# Patient Record
Sex: Female | Born: 1960 | Hispanic: No | Marital: Married | Smoking: Never smoker
Health system: Southern US, Community
[De-identification: ages and names within clinical notes are randomized; demographics above are authoritative.]

## PROBLEM LIST (undated history)

## (undated) DIAGNOSIS — E785 Hyperlipidemia, unspecified: Secondary | ICD-10-CM

## (undated) DIAGNOSIS — E039 Hypothyroidism, unspecified: Secondary | ICD-10-CM

## (undated) DIAGNOSIS — D509 Iron deficiency anemia, unspecified: Secondary | ICD-10-CM

## (undated) DIAGNOSIS — N183 Chronic kidney disease, stage 3 unspecified: Secondary | ICD-10-CM

## (undated) DIAGNOSIS — I10 Essential (primary) hypertension: Secondary | ICD-10-CM

## (undated) DIAGNOSIS — E669 Obesity, unspecified: Secondary | ICD-10-CM

## (undated) DIAGNOSIS — E079 Disorder of thyroid, unspecified: Secondary | ICD-10-CM

## (undated) DIAGNOSIS — R739 Hyperglycemia, unspecified: Secondary | ICD-10-CM

## (undated) HISTORY — PX: TUBAL LIGATION: SHX77

## (undated) HISTORY — DX: Chronic kidney disease, stage 3 (moderate): N18.3

## (undated) HISTORY — DX: Hyperlipidemia, unspecified: E78.5

## (undated) HISTORY — DX: Obesity, unspecified: E66.9

## (undated) HISTORY — DX: Hyperglycemia, unspecified: R73.9

## (undated) HISTORY — DX: Chronic kidney disease, stage 3 unspecified: N18.30

## (undated) HISTORY — DX: Iron deficiency anemia, unspecified: D50.9

## (undated) HISTORY — DX: Hypothyroidism, unspecified: E03.9

---

## 2005-07-08 ENCOUNTER — Other Ambulatory Visit: Admission: RE | Admit: 2005-07-08 | Discharge: 2005-07-08 | Payer: Self-pay | Admitting: Family Medicine

## 2007-01-20 ENCOUNTER — Other Ambulatory Visit: Admission: RE | Admit: 2007-01-20 | Discharge: 2007-01-20 | Payer: Self-pay | Admitting: Family Medicine

## 2008-07-17 ENCOUNTER — Other Ambulatory Visit: Admission: RE | Admit: 2008-07-17 | Discharge: 2008-07-17 | Payer: Self-pay | Admitting: Family Medicine

## 2010-07-22 ENCOUNTER — Other Ambulatory Visit (HOSPITAL_COMMUNITY)
Admission: RE | Admit: 2010-07-22 | Discharge: 2010-07-22 | Disposition: A | Discharge status: Home / Self Care | Payer: Self-pay | Source: Ambulatory Visit | Attending: Family Medicine | Admitting: Family Medicine

## 2010-07-22 DIAGNOSIS — Z124 Encounter for screening for malignant neoplasm of cervix: Secondary | ICD-10-CM | POA: Insufficient documentation

## 2010-07-23 ENCOUNTER — Other Ambulatory Visit: Payer: Self-pay | Admitting: Family Medicine

## 2013-02-21 ENCOUNTER — Other Ambulatory Visit: Payer: Self-pay | Admitting: Family Medicine

## 2013-02-21 ENCOUNTER — Ambulatory Visit
Admission: RE | Admit: 2013-02-21 | Discharge: 2013-02-21 | Disposition: A | Discharge status: Home / Self Care | Payer: BC Managed Care – PPO | Source: Ambulatory Visit | Attending: Family Medicine | Admitting: Family Medicine

## 2013-02-21 DIAGNOSIS — I10 Essential (primary) hypertension: Secondary | ICD-10-CM

## 2013-02-21 DIAGNOSIS — M25579 Pain in unspecified ankle and joints of unspecified foot: Secondary | ICD-10-CM

## 2013-05-04 ENCOUNTER — Emergency Department (HOSPITAL_COMMUNITY)
Admission: EM | Admit: 2013-05-04 | Discharge: 2013-05-04 | Disposition: A | Discharge status: Home / Self Care | Payer: 59 | Source: Home / Self Care

## 2013-05-04 ENCOUNTER — Encounter (HOSPITAL_COMMUNITY): Payer: Self-pay | Admitting: Emergency Medicine

## 2013-05-04 DIAGNOSIS — L218 Other seborrheic dermatitis: Secondary | ICD-10-CM

## 2013-05-04 DIAGNOSIS — L219 Seborrheic dermatitis, unspecified: Secondary | ICD-10-CM

## 2013-05-04 HISTORY — DX: Essential (primary) hypertension: I10

## 2013-05-04 HISTORY — DX: Disorder of thyroid, unspecified: E07.9

## 2013-05-04 MED ORDER — CLOTRIMAZOLE-BETAMETHASONE 1-0.05 % EX CREA: TOPICAL_CREAM | CUTANEOUS | Status: AC

## 2013-05-04 NOTE — ED Provider Notes (Signed)
Medical screening examination/treatment/procedure(s) were performed by non-physician practitioner and as supervising physician I was immediately available for consultation/collaboration.  Jakevion Arney, M.D.  Tita Terhaar C Antania Hoefling, MD 05/04/13 2303 

## 2013-05-04 NOTE — ED Notes (Signed)
C/o 3 month duration of blistering and itching in scalp not relieved w selsun shampoo. C/o she cannot get an appointment w dermatologist for 3 months, "and I'll be bald by then"

## 2013-05-04 NOTE — ED Provider Notes (Signed)
CSN: [id number]     Arrival date & time 05/04/13  1653 History   First MD Initiated Contact with Patient 05/04/13 1931     Chief Complaint  Patient presents with  . Hair/Scalp Problem   (Consider location/radiation/quality/duration/timing/severity/associated sxs/prior Treatment) HPI Comments: 53 year old female complaining of a ring of pruritic plaques with silver scales and flaking to the hairline at the right frontoparietal scalp. This started several weeks ago. [facility] which is helped with some of the redness and inflammation but continues to have the hair loss and plaques as above.   Past Medical History  Diagnosis Date  . Thyroid disease   . Hypertension    History reviewed. No pertinent past surgical history. History reviewed. No pertinent family history. History  Substance Use Topics  . Smoking status: Never Smoker   . Smokeless tobacco: Not on file  . Alcohol Use: No   OB History   Grav Para Term Preterm Abortions TAB SAB Ect Mult Living                 Review of Systems  Constitutional: Negative.   Skin:       As per history of present illness  All other systems reviewed and are negative.    Allergies  Motrin  Home Medications   Current Outpatient Rx  Name  Route  Sig  Dispense  Refill  . levothyroxine (SYNTHROID, LEVOTHROID) 100 MCG tablet   Oral   Take 100 mcg by mouth daily before breakfast.         . triamterene-hydrochlorothiazide (MAXZIDE) 75-50 MG per tablet   Oral   Take 1 tablet by mouth daily.         . clotrimazole-betamethasone (LOTRISONE) cream      Apply to affected area 2 times daily prn   45 g   0    BP 145/95  Pulse 98  Temp(Src) 98.1 F (36.7 C) (Oral)  Resp 16  SpO2 100% Physical Exam  Nursing note and vitals reviewed. Constitutional: She is oriented to person, place, and time. She appears well-developed and well-nourished. No distress.  Neck: Normal range of motion. Neck supple.   Pulmonary/Chest: Effort normal. No respiratory distress.  Neurological: She is alert and oriented to person, place, and time.  Skin: Skin is warm and dry.  See history of present illness. A ring of plaques covered with silvery and darkening scales associated with much flaking and desquamation. No current erythema, drainage or signs of bacterial infection. This is limited to an area approximately 5 x 4 cm.  Psychiatric: She has a normal mood and affect.    ED Course  Procedures (including critical care time) Labs Review Labs Reviewed - No data to display Imaging Review No results found.   MDM   1. Seborrheic dermatitis of scalp      Selsun Blue twice a week Lotrisone twice a day Followup your PCP or keep dermatology appointment in 3 months  Hayden Rasmussenavid Tira Lafferty, NP 05/04/13 2005

## 2013-05-04 NOTE — Discharge Instructions (Signed)
Seborrheic Dermatitis Continue the [employer] twice a week. Seborrheic dermatitis involves pink or red skin with greasy, flaky scales. This is often found on the scalp, eyebrows, nose, bearded area, and on or behind the ears. It can also occur on the central chest. It often occurs where there are more oil (sebaceous) glands. This condition is also known as dandruff. When this condition affects a baby's scalp, it is called cradle cap. It may come and go for no known reason. It can occur at any time of life from infancy to old age. CAUSES  The cause is unknown. It is not the result of too little moisture or too much oil. In some people, seborrheic dermatitis flare-ups seem to be triggered by stress. It also commonly occurs in people with certain diseases such as Parkinson's disease or HIV/AIDS. SYMPTOMS   Thick scales on the scalp.  Redness on the face or in the armpits.  The skin may seem oily or dry, but moisturizers do not help.  In infants, seborrheic dermatitis appears as scaly redness that does not seem to bother the baby. In some babies, it affects only the scalp. In others, it also affects the neck creases, armpits, groin, or behind the ears.  In adults and adolescents, seborrheic dermatitis may affect only the scalp. It may look patchy or spread out, with areas of redness and flaking. Other areas commonly affected include:  Eyebrows.  Eyelids.  Forehead.  Skin behind the ears.  Outer ears.  Chest.  Armpits.  Nose creases.  Skin creases under the breasts.  Skin between the buttocks.  Groin.  Some adults and adolescents feel itching or burning in the affected areas. DIAGNOSIS  Your caregiver can usually tell what the problem is by doing a physical exam. TREATMENT   Cortisone (steroid) ointments, creams, and lotions can help decrease inflammation.  Babies can be treated with baby oil to soften the scales, then they may be washed with baby shampoo. If this does not  help, a prescription topical steroid medicine may work.  Adults can use medicated shampoos.  Your caregiver may prescribe corticosteroid cream and shampoo containing an antifungal or yeast medicine (ketoconazole). Hydrocortisone or anti-yeast cream can be rubbed directly onto seborrheic dermatitis patches. Yeast does not cause seborrheic dermatitis, but it seems to add to the problem. In infants, seborrheic dermatitis is often worst during the first year of life. It tends to disappear on its own as the child grows. However, it may return during the teenage years. In adults and adolescents, seborrheic dermatitis tends to be a long-lasting condition that comes and goes over many years. HOME CARE INSTRUCTIONS   Use prescribed medicines as directed.  In infants, do not aggressively remove the scales or flakes on the scalp with a comb or by other means. This may lead to hair loss. SEEK MEDICAL CARE IF:   The problem does not improve from the medicated shampoos, lotions, or other medicines given by your caregiver.  You have any other questions or concerns. Document Released: 02/22/2005 Document Revised: 08/24/2011 Document Reviewed: 07/14/2009 [facility] Patient Information 2014 [employer], [place].

## 2013-09-06 ENCOUNTER — Other Ambulatory Visit: Payer: Self-pay | Admitting: Family Medicine

## 2013-09-06 ENCOUNTER — Other Ambulatory Visit (HOSPITAL_COMMUNITY)
Admission: RE | Admit: 2013-09-06 | Discharge: 2013-09-06 | Disposition: A | Discharge status: Home / Self Care | Payer: 59 | Source: Ambulatory Visit | Attending: Family Medicine | Admitting: Family Medicine

## 2013-09-06 DIAGNOSIS — Z1151 Encounter for screening for human papillomavirus (HPV): Secondary | ICD-10-CM | POA: Insufficient documentation

## 2013-09-06 DIAGNOSIS — Z124 Encounter for screening for malignant neoplasm of cervix: Secondary | ICD-10-CM | POA: Insufficient documentation

## 2013-09-11 LAB — CYTOLOGY - PAP

## 2014-12-23 IMAGING — CR DG ANKLE COMPLETE 3+V*R*
3 series · 3 of 3 positions shown · non-contrast
Comparison: Left ankle examination.

CLINICAL DATA: History of soft tissue swelling and pain. No history
of trauma.

EXAM:
RIGHT ANKLE - COMPLETE 3+ VIEW

[view not recorded (1 of 3)]
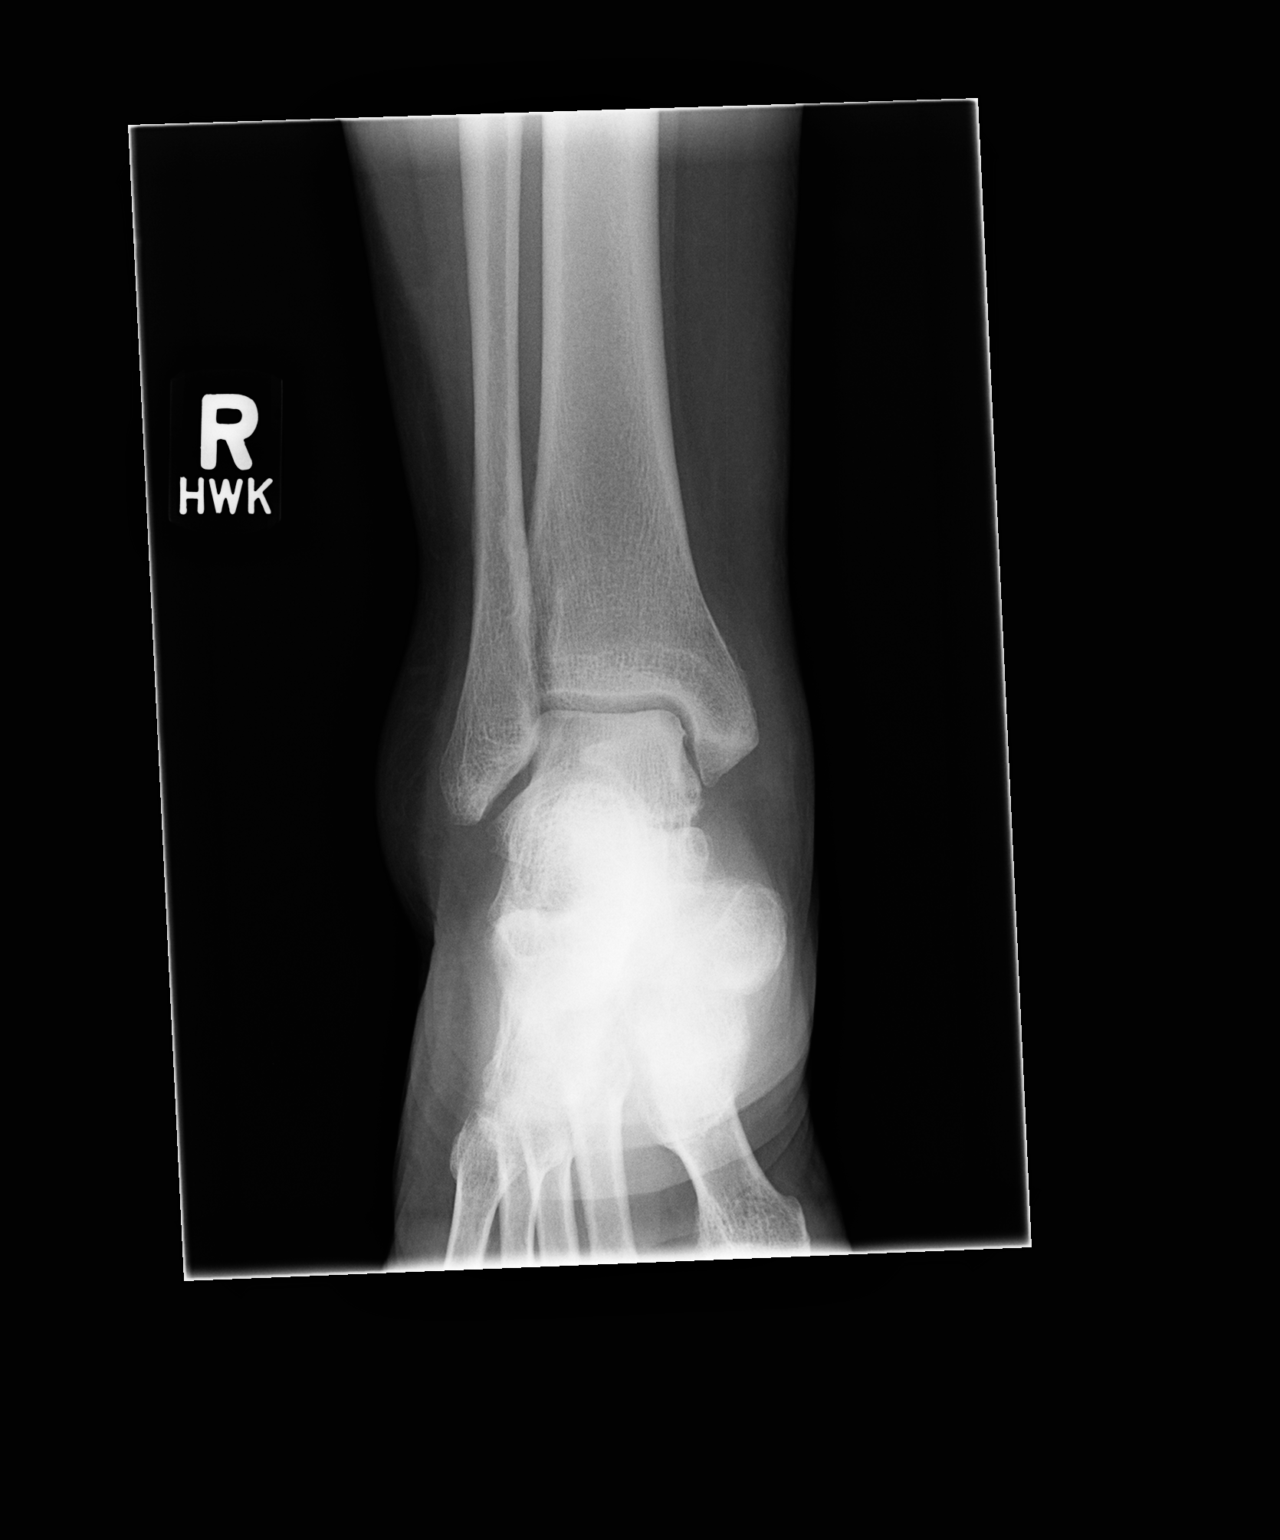

[view not recorded (2 of 3)]
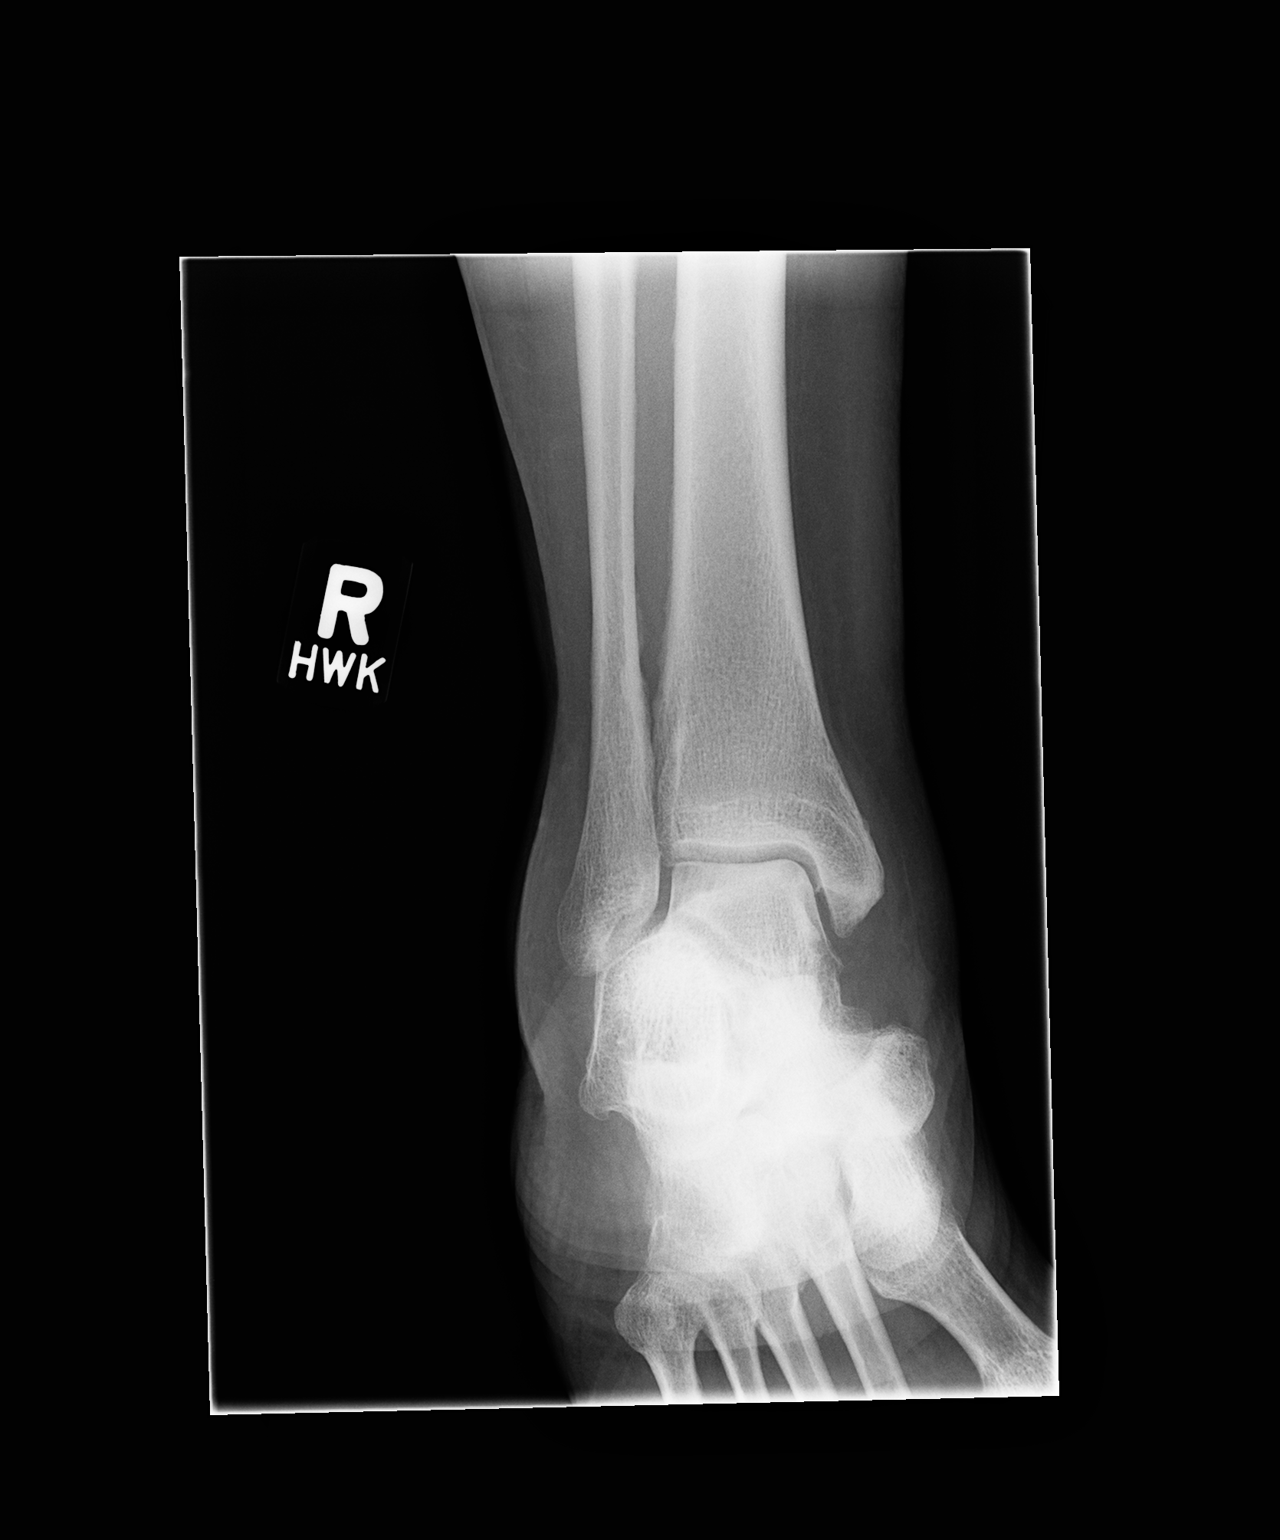

[view not recorded (3 of 3)]
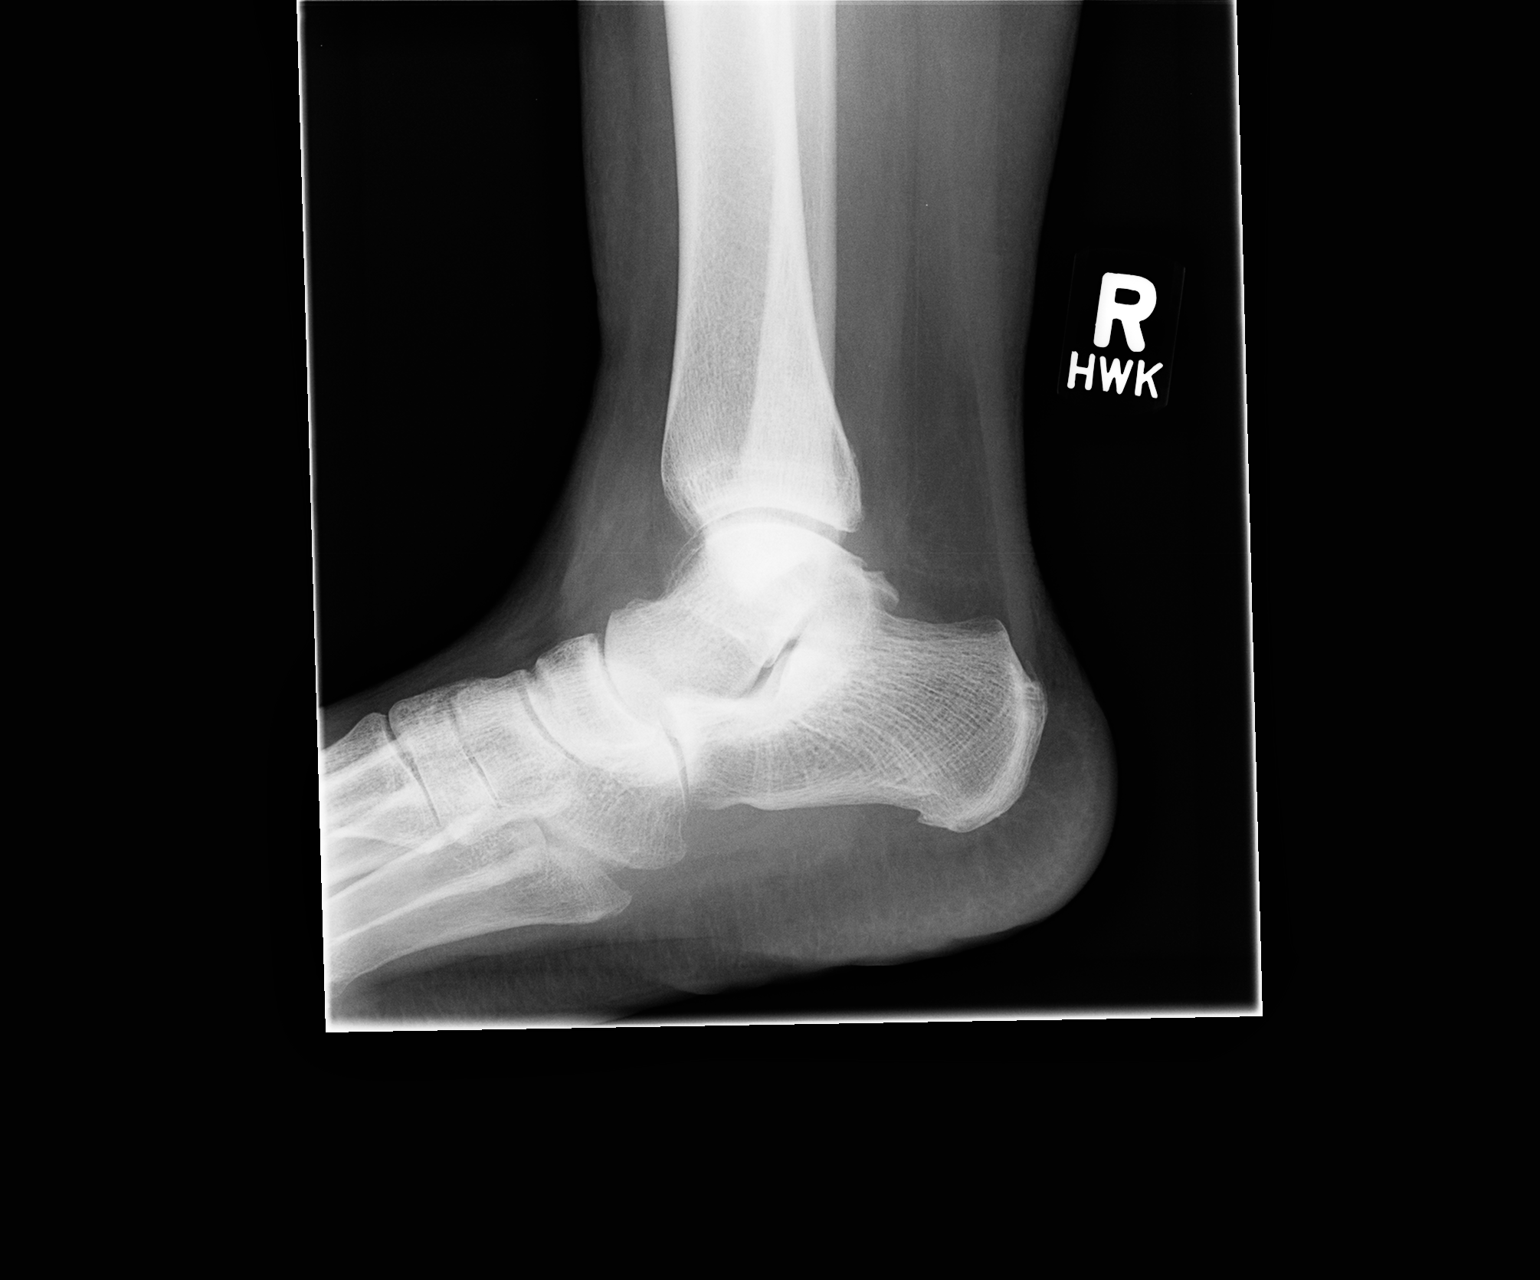

[3 of 3 positions shown; findings below may reference images not displayed]

FINDINGS: There is a mild soft tissue thickening or swelling. Alignment is
normal. No fracture, dislocation, or bony destruction is seen.
Minimal posterior and plantar calcaneal spurring is present.
IMPRESSION: Mild soft tissue thickening or swelling. Minimal posterior and
plantar calcaneal spurring.

## 2015-09-29 ENCOUNTER — Encounter: Payer: Self-pay | Admitting: Cardiology

## 2015-10-17 ENCOUNTER — Encounter: Payer: Self-pay | Admitting: Cardiology

## 2015-10-17 ENCOUNTER — Encounter (INDEPENDENT_AMBULATORY_CARE_PROVIDER_SITE_OTHER): Payer: Self-pay

## 2015-10-17 ENCOUNTER — Ambulatory Visit (INDEPENDENT_AMBULATORY_CARE_PROVIDER_SITE_OTHER): Payer: 59 | Admitting: Cardiology

## 2015-10-17 VITALS — BP 138/100 | HR 76 | Ht 64.5 in | Wt 222.6 lb

## 2015-10-17 DIAGNOSIS — R0789 Other chest pain: Secondary | ICD-10-CM

## 2015-10-17 DIAGNOSIS — I10 Essential (primary) hypertension: Secondary | ICD-10-CM | POA: Diagnosis not present

## 2015-10-17 DIAGNOSIS — Z8249 Family history of ischemic heart disease and other diseases of the circulatory system: Secondary | ICD-10-CM | POA: Diagnosis not present

## 2015-10-17 DIAGNOSIS — I739 Peripheral vascular disease, unspecified: Secondary | ICD-10-CM | POA: Diagnosis not present

## 2015-10-17 NOTE — Progress Notes (Signed)
Cardiology Office Note    Date:  10/17/2015   ID:  Candice MaywoodMary M Mcdonnell, DOB 04/15/1960, MRN [id number]  PCP:  Frederich ChickWEBB, CAROL D, MD  Cardiologist:   Donato SchultzMark Skains, MD   No chief complaint on file.   History of Present Illness:  Candice Preston is a 55 y.o. female here for evaluation of atypical chest pain at the request of Dr. Hyman HopesWebb. Her mother died at age 53 from myocardial infarction.  In review of office note from 09/11/15 from Dr. Hyman HopesWebb, she stated that she was having occasional chest discomfort usually when sitting down and felt better when she pressed on her chest wall. Feels it when she is stressed and moving. Once while walking at work noted it after eating. She felt as though it was like trapped gas that sometimes is worse with movements or different positions. She would feel it every other day. She is concerned however because of her mother's heart attack at age 53.  She is a nonsmoker, no prior cardiac evaluation.  Leg pain with walking at times and burning at rest. She has also been diagnosed in the past with plantar fasciitis and occasionally will have shooting neuropathic pain from her feet/heels.  Hemoglobin A1c 5.5, creatinine 1.1, ALT 18, LDL 143, HDL 47.    Past Medical History:  Diagnosis Date  . CKD (chronic kidney disease), stage III   . Hyperglycemia   . Hyperlipidemia   . Hypertension   . Hypothyroidism   . Iron deficiency anemia   . Obesity   . Thyroid disease     Past Surgical History:  Procedure Laterality Date  . CESAREAN SECTION  1987, 1988   x2  . TUBAL LIGATION Bilateral     Current Medications: Outpatient Medications Prior to Visit  Medication Sig Dispense Refill  . clotrimazole-betamethasone (LOTRISONE) cream Apply to affected area 2 times daily prn 45 g 0  . Cyanocobalamin (VITAMIN B-12) 5000 MCG TBDP Take 1 tablet by mouth every other day.    . levothyroxine (SYNTHROID, LEVOTHROID) 100 MCG tablet Take 100 mcg by mouth daily before breakfast.    . Multiple  Vitamin (MULTIVITAMIN) tablet Take 1 tablet by mouth every other day.    . triamterene-hydrochlorothiazide (MAXZIDE) 75-50 MG per tablet Take 1 tablet by mouth daily.     No facility-administered medications prior to visit.      Allergies:   Motrin [ibuprofen]   Social History   Social History  . Marital status: Married    Spouse name: N/A  . Number of children: 2  . Years of education: N/A   Occupational History  . [employer]    Social History Main Topics  . Smoking status: Never Smoker  . Smokeless tobacco: Never Used  . Alcohol use No  . Drug use: No  . Sexual activity: Yes   Other Topics Concern  . Not on file   Social History Narrative  . No narrative on file     Family History:  The patient's family history includes CVA (age of onset: 3561) in her father; Heart attack (age of onset: 7137) in her mother; Hypertension (age of onset: 5142) in her brother; Hypertension (age of onset: 2957) in her sister; Hypertension (age of onset: 5961) in her father; Kidney disease (age of onset: 9442) in her sister.   ROS:   Please see the history of present illness.    ROS All other systems reviewed and are negative.   PHYSICAL EXAM:  VS:  BP (!) 138/100 (BP Location: Left Arm, Patient Position: Sitting, Cuff Size: Large)   Pulse 76   Ht 5' 4.5" (1.638 m)   Wt 222 lb 9.6 oz (101 kg)   BMI 37.62 kg/m    GEN: Well nourished, well developed, in no acute distress  HEENT: normal  Neck: no JVD, carotid bruits, or masses Cardiac: RRR; no murmurs, rubs, or gallops,no edema -challenging to palpate distal pulses Respiratory:  clear to auscultation bilaterally, normal work of breathing GI: soft, nontender, nondistended, + BS MS: no deformity or atrophy  Skin: warm and dry, no rash Neuro:  Alert and Oriented x 3, Strength and sensation are intact Psych: euthymic mood, full affect  Wt Readings from Last 3 Encounters:  10/17/15 222 lb 9.6 oz (101 kg)      Studies/Labs  Reviewed:   EKG:  EKG is ordered today.  The ekg ordered today demonstrates 10/17/15-sinus rhythm heart rate 76 bpm with no other abnormalities. Personally viewed.  Recent Labs: No results found for requested labs within last 8760 hours.   Lipid Panel No results found for: CHOL, TRIG, HDL, CHOLHDL, VLDL, LDLCALC, LDLDIRECT  Additional studies/ records that were reviewed today include:  Prior office notes, lab work, EKG reviewed    ASSESSMENT:    1. Other chest pain   2. Claudication (HCC)   3. Family history of early CAD   4. Essential hypertension      PLAN:  In order of problems listed above:  Atypical chest pain  - Given her family history, mother having myocardial infarction at age 59, hypertension I will go ahead and set her up for treadmill stress test. Her baseline EKG is normal. This will be helpful in determining any evidence of ischemia. Her chest pain does have atypical qualities to it and may in fact be GERD related or musculoskeletal. However, I think further cardiac testing is warranted.  Claudication/leg pain  - I will also set her up for ABIs to evaluate for any evidence of peripheral vascular disease. Challenging to palpate distal pulses.      Medication Adjustments/Labs and Tests Ordered: Current medicines are reviewed at length with the patient today.  Concerns regarding medicines are outlined above.  Medication changes, Labs and Tests ordered today are listed in the Patient Instructions below. Patient Instructions  Medication Instructions:  The current medical regimen is effective;  continue present plan and medications.  Testing/Procedures: Your physician has requested that you have an exercise tolerance test. For further information please visit [email or web address] Please also follow instruction sheet, as given.  Your physician has requested that you have an ankle brachial index (ABI). During this test an ultrasound and blood pressure cuff are used  to evaluate the arteries that supply the arms and legs with blood. Allow thirty minutes for this exam. There are no restrictions or special instructions.  Follow-Up: Follow up as needed with Dr Anne Fu after testing.  Thank you for choosing [facility]!!        Signed, Donato Schultz, MD  10/17/2015 12:42 PM    [facility] [address] [place], [place], [place]  [address] Phone: [phone]; Fax: [phone]

## 2015-10-17 NOTE — Patient Instructions (Signed)
Medication Instructions:  The current medical regimen is effective;  continue present plan and medications.  Testing/Procedures: Your physician has requested that you have an exercise tolerance test. For further information please visit [email or web address] Please also follow instruction sheet, as given.  Your physician has requested that you have an ankle brachial index (ABI). During this test an ultrasound and blood pressure cuff are used to evaluate the arteries that supply the arms and legs with blood. Allow thirty minutes for this exam. There are no restrictions or special instructions.  Follow-Up: Follow up as needed with Dr Anne FuSkains after testing.  Thank you for choosing  [facility]!!

## 2015-10-23 ENCOUNTER — Encounter (INDEPENDENT_AMBULATORY_CARE_PROVIDER_SITE_OTHER): Payer: Self-pay

## 2015-10-23 ENCOUNTER — Ambulatory Visit (HOSPITAL_COMMUNITY)
Admission: RE | Admit: 2015-10-23 | Discharge: 2015-10-23 | Disposition: A | Discharge status: Home / Self Care | Payer: 59 | Source: Ambulatory Visit | Attending: Cardiology | Admitting: Cardiology

## 2015-10-23 DIAGNOSIS — I129 Hypertensive chronic kidney disease with stage 1 through stage 4 chronic kidney disease, or unspecified chronic kidney disease: Secondary | ICD-10-CM | POA: Diagnosis not present

## 2015-10-23 DIAGNOSIS — N183 Chronic kidney disease, stage 3 (moderate): Secondary | ICD-10-CM | POA: Diagnosis not present

## 2015-10-23 DIAGNOSIS — I739 Peripheral vascular disease, unspecified: Secondary | ICD-10-CM | POA: Diagnosis not present

## 2015-10-23 DIAGNOSIS — E039 Hypothyroidism, unspecified: Secondary | ICD-10-CM | POA: Diagnosis not present

## 2015-10-23 DIAGNOSIS — E785 Hyperlipidemia, unspecified: Secondary | ICD-10-CM | POA: Diagnosis not present

## 2015-11-05 ENCOUNTER — Ambulatory Visit (INDEPENDENT_AMBULATORY_CARE_PROVIDER_SITE_OTHER): Payer: 59

## 2015-11-05 DIAGNOSIS — R0789 Other chest pain: Secondary | ICD-10-CM

## 2015-11-05 LAB — EXERCISE TOLERANCE TEST
CHL CUP MPHR: 167 {beats}/min
CSEPHR: 98 %
Estimated workload: 7 METS
Exercise duration (min): 6 min
Exercise duration (sec): 0 s
Peak HR: 164 {beats}/min
RPE: 16
Rest HR: 93 {beats}/min

## 2015-11-20 ENCOUNTER — Encounter: Payer: Self-pay | Admitting: *Deleted

## 2016-09-24 ENCOUNTER — Other Ambulatory Visit: Payer: Self-pay | Admitting: Family Medicine

## 2016-09-24 ENCOUNTER — Other Ambulatory Visit (HOSPITAL_COMMUNITY)
Admission: RE | Admit: 2016-09-24 | Discharge: 2016-09-24 | Disposition: A | Discharge status: Home / Self Care | Payer: 59 | Source: Ambulatory Visit | Attending: Family Medicine | Admitting: Family Medicine

## 2016-09-24 DIAGNOSIS — Z01411 Encounter for gynecological examination (general) (routine) with abnormal findings: Secondary | ICD-10-CM | POA: Diagnosis present

## 2016-09-24 DIAGNOSIS — Z1151 Encounter for screening for human papillomavirus (HPV): Secondary | ICD-10-CM | POA: Insufficient documentation

## 2016-09-27 LAB — CYTOLOGY - PAP
Diagnosis: NEGATIVE
HPV (WINDOPATH): NOT DETECTED

## 2016-10-12 ENCOUNTER — Other Ambulatory Visit: Payer: Self-pay | Admitting: Family Medicine

## 2016-10-12 DIAGNOSIS — Z1231 Encounter for screening mammogram for malignant neoplasm of breast: Secondary | ICD-10-CM

## 2016-10-27 ENCOUNTER — Ambulatory Visit
Admission: RE | Admit: 2016-10-27 | Discharge: 2016-10-27 | Disposition: A | Discharge status: Home / Self Care | Payer: 59 | Source: Ambulatory Visit | Attending: Family Medicine | Admitting: Family Medicine

## 2016-10-27 ENCOUNTER — Other Ambulatory Visit: Payer: Self-pay | Admitting: Family Medicine

## 2016-10-27 DIAGNOSIS — Z1231 Encounter for screening mammogram for malignant neoplasm of breast: Secondary | ICD-10-CM

## 2016-11-01 ENCOUNTER — Other Ambulatory Visit: Payer: Self-pay | Admitting: Family Medicine

## 2016-11-01 DIAGNOSIS — R928 Other abnormal and inconclusive findings on diagnostic imaging of breast: Secondary | ICD-10-CM

## 2016-11-04 ENCOUNTER — Ambulatory Visit
Admission: RE | Admit: 2016-11-04 | Discharge: 2016-11-04 | Disposition: A | Discharge status: Home / Self Care | Payer: 59 | Source: Ambulatory Visit | Attending: Family Medicine | Admitting: Family Medicine

## 2016-11-04 DIAGNOSIS — R928 Other abnormal and inconclusive findings on diagnostic imaging of breast: Secondary | ICD-10-CM

## 2017-08-09 ENCOUNTER — Other Ambulatory Visit: Payer: Self-pay | Admitting: Gastroenterology

## 2017-08-09 DIAGNOSIS — R112 Nausea with vomiting, unspecified: Secondary | ICD-10-CM

## 2017-08-24 ENCOUNTER — Ambulatory Visit
Admission: RE | Admit: 2017-08-24 | Discharge: 2017-08-24 | Disposition: A | Discharge status: Home / Self Care | Payer: Managed Care, Other (non HMO) | Source: Ambulatory Visit | Attending: Gastroenterology | Admitting: Gastroenterology

## 2017-08-24 DIAGNOSIS — R112 Nausea with vomiting, unspecified: Secondary | ICD-10-CM

## 2017-11-18 ENCOUNTER — Other Ambulatory Visit: Payer: Self-pay | Admitting: Family Medicine

## 2017-11-18 DIAGNOSIS — Z1231 Encounter for screening mammogram for malignant neoplasm of breast: Secondary | ICD-10-CM

## 2017-12-19 ENCOUNTER — Ambulatory Visit
Admission: RE | Admit: 2017-12-19 | Discharge: 2017-12-19 | Disposition: A | Discharge status: Home / Self Care | Payer: Managed Care, Other (non HMO) | Source: Ambulatory Visit | Attending: Family Medicine | Admitting: Family Medicine

## 2017-12-19 DIAGNOSIS — Z1231 Encounter for screening mammogram for malignant neoplasm of breast: Secondary | ICD-10-CM

## 2018-02-24 ENCOUNTER — Other Ambulatory Visit: Payer: Self-pay | Admitting: Gastroenterology

## 2018-02-24 ENCOUNTER — Other Ambulatory Visit (HOSPITAL_COMMUNITY): Payer: Self-pay | Admitting: Gastroenterology

## 2018-02-24 DIAGNOSIS — K760 Fatty (change of) liver, not elsewhere classified: Secondary | ICD-10-CM

## 2018-03-02 ENCOUNTER — Other Ambulatory Visit (HOSPITAL_COMMUNITY): Payer: Self-pay | Admitting: Gastroenterology

## 2018-03-02 DIAGNOSIS — K760 Fatty (change of) liver, not elsewhere classified: Secondary | ICD-10-CM

## 2018-05-10 ENCOUNTER — Ambulatory Visit (HOSPITAL_COMMUNITY): Payer: Managed Care, Other (non HMO)

## 2018-05-17 ENCOUNTER — Ambulatory Visit (HOSPITAL_COMMUNITY)
Admission: RE | Admit: 2018-05-17 | Discharge: 2018-05-17 | Disposition: A | Discharge status: Home / Self Care | Payer: Managed Care, Other (non HMO) | Source: Ambulatory Visit | Attending: Gastroenterology | Admitting: Gastroenterology

## 2018-05-17 DIAGNOSIS — K760 Fatty (change of) liver, not elsewhere classified: Secondary | ICD-10-CM | POA: Diagnosis not present

## 2018-12-13 ENCOUNTER — Other Ambulatory Visit: Payer: Self-pay | Admitting: Family Medicine

## 2018-12-13 DIAGNOSIS — Z1231 Encounter for screening mammogram for malignant neoplasm of breast: Secondary | ICD-10-CM

## 2019-01-29 ENCOUNTER — Ambulatory Visit
Admission: RE | Admit: 2019-01-29 | Discharge: 2019-01-29 | Disposition: A | Discharge status: Home / Self Care | Payer: Managed Care, Other (non HMO) | Source: Ambulatory Visit | Attending: Family Medicine | Admitting: Family Medicine

## 2019-01-29 ENCOUNTER — Other Ambulatory Visit: Payer: Self-pay

## 2019-01-29 DIAGNOSIS — Z1231 Encounter for screening mammogram for malignant neoplasm of breast: Secondary | ICD-10-CM

## 2019-03-15 ENCOUNTER — Other Ambulatory Visit: Payer: Managed Care, Other (non HMO)

## 2019-03-16 ENCOUNTER — Ambulatory Visit: Payer: Managed Care, Other (non HMO) | Attending: Internal Medicine

## 2019-03-16 DIAGNOSIS — Z20822 Contact with and (suspected) exposure to covid-19: Secondary | ICD-10-CM

## 2019-03-17 ENCOUNTER — Telehealth: Payer: Self-pay

## 2019-03-17 NOTE — Telephone Encounter (Signed)
Pt called for covid results advised that results are not back 

## 2019-03-18 LAB — NOVEL CORONAVIRUS, NAA: SARS-CoV-2, NAA: NOT DETECTED

## 2019-03-19 ENCOUNTER — Ambulatory Visit: Payer: Self-pay | Admitting: *Deleted

## 2019-03-19 NOTE — Telephone Encounter (Signed)
Message from Geronimo Boot sent at 03/19/2019 11:04 AM EST  Summary: Clinical Advice    Patient stated she tested negative for her family member that lives with her testes positive and she wants to know what she needs to do. Please advise          Pt called for clarification of CDC requirements for returning to work after exposure.  Reason for Disposition . [1] Living in area with community spread (identified by local PHD) BUT [2] NO symptoms  Answer Assessment - Initial Assessment Questions 1. COVID-19 CLOSE CONTACT: "Who is the person with the confirmed or suspected COVID-19 infection that you were exposed to?"     yes 2. PLACE of CONTACT: "Where were you when you were exposed to COVID-19?" (e.g., home, school, medical waiting room; which city?)     home 3. TYPE of CONTACT: "How much contact was there?" (e.g., sitting next to, live in same house, work in same office, same building)     Live with 4. DURATION of CONTACT: "How long were you in contact with the COVID-19 patient?" (e.g., a few seconds, passed by person, a few minutes, 15 minutes or longer, live with the patient)     Off and on 5. MASK: "Were you wearing a mask?" "Was the other person wearing a mask?" Note: wearing a mask reduces the risk of an  otherwise close contact.     yes 6. DATE of CONTACT: "When did you have contact with a COVID-19 patient?" (e.g., how many days ago)     1/8 7. COMMUNITY SPREAD: "Are there lots of cases of COVID-19 (community spread) where you live?" (See public health department website, if unsure)       yes 8. SYMPTOMS: "Do you have any symptoms?" (e.g., fever, cough, breathing difficulty, loss of taste or smell)     no 9. PREGNANCY OR POSTPARTUM: "Is there any chance you are pregnant?" "When was your last menstrual period?" "Did you deliver in the last 2 weeks?"     no 10. HIGH RISK: "Do you have any heart or lung problems? Do you have a weak immune system?" (e.g., heart failure, COPD, asthma,  HIV positive, chemotherapy, renal failure, diabetes mellitus, sickle cell anemia, obesity)       no 11.  TRAVEL: "Have you traveled out of the country recently?" If so, "When and where?"  Also ask about out-of-state travel, since the CDC has identified some high-risk cities for community spread in the [place].  Note: Travel becomes less relevant if there is widespread community transmission where the patient lives.       no  Protocols used: CORONAVIRUS (COVID-19) EXPOSURE-A-AH

## 2020-01-01 ENCOUNTER — Other Ambulatory Visit: Payer: Self-pay | Admitting: Family Medicine

## 2020-01-01 DIAGNOSIS — Z1231 Encounter for screening mammogram for malignant neoplasm of breast: Secondary | ICD-10-CM

## 2020-02-07 ENCOUNTER — Ambulatory Visit: Payer: Managed Care, Other (non HMO)

## 2020-03-17 ENCOUNTER — Ambulatory Visit: Payer: Managed Care, Other (non HMO)

## 2020-03-17 IMAGING — US US ABDOMEN COMPLETE WITH ELASTOGRAPHY
2 series · 13 of 25 positions shown · non-contrast
Comparison: Abdomen ultrasound on 08/24/2017

CLINICAL DATA: Fatty liver.



[Series 1: us abdomen complete with elastography · 11 of 81 slices shown (1 of 2)]
[im 1/81]
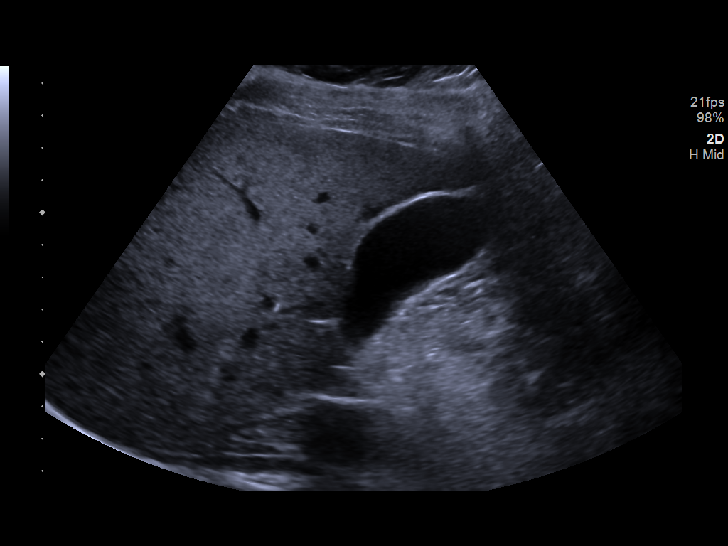
[im 9/81]
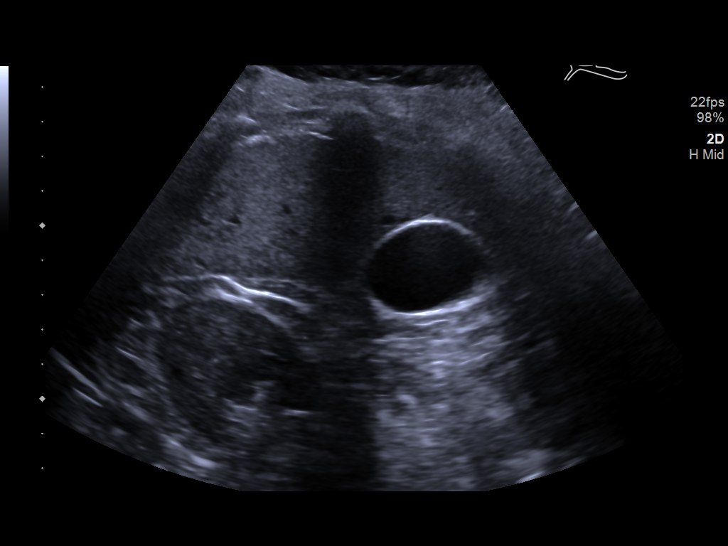
[im 17/81]
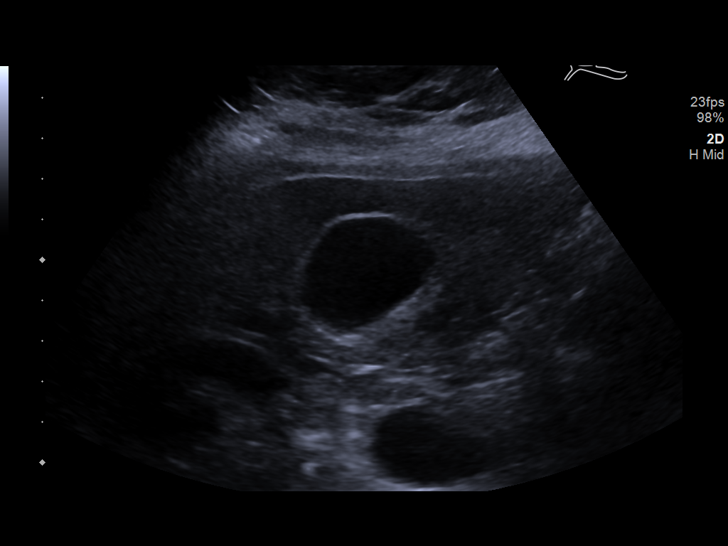
[im 25/81]
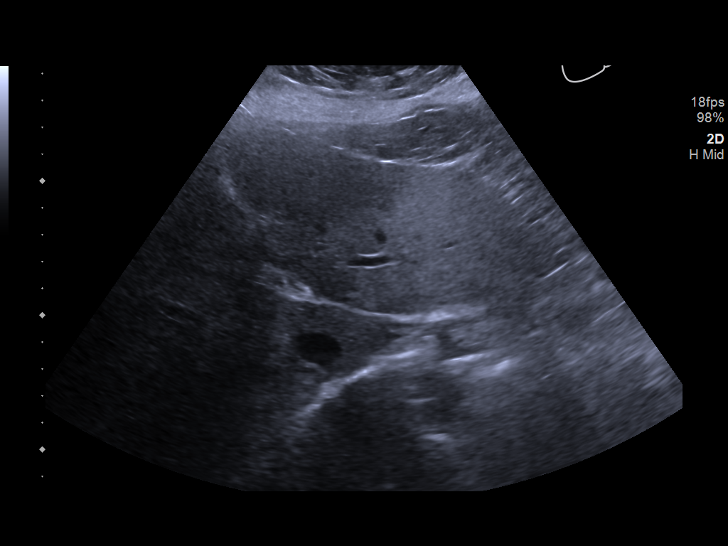
[im 33/81]
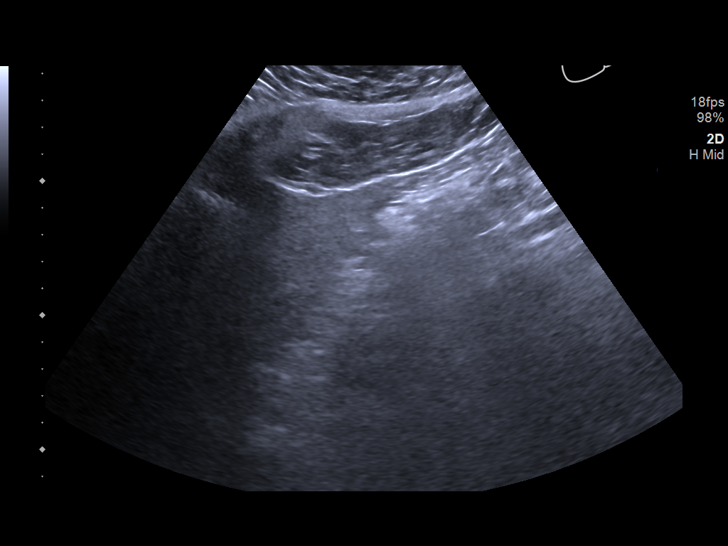
[im 41/81]
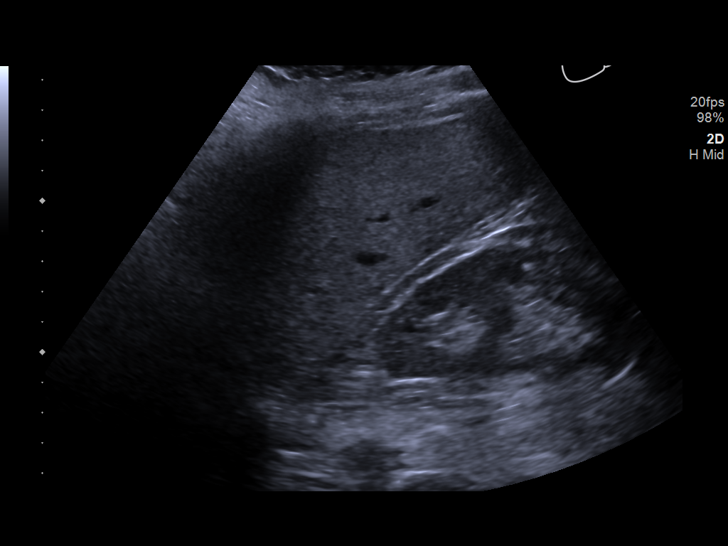
[im 49/81]
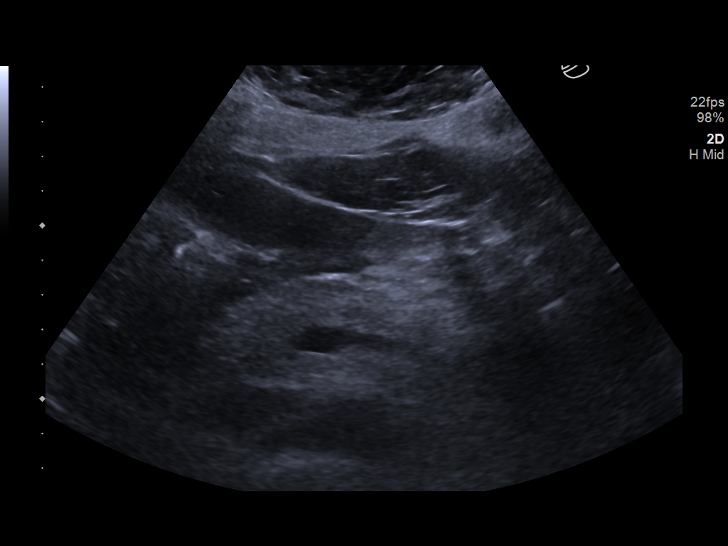
[im 57/81]
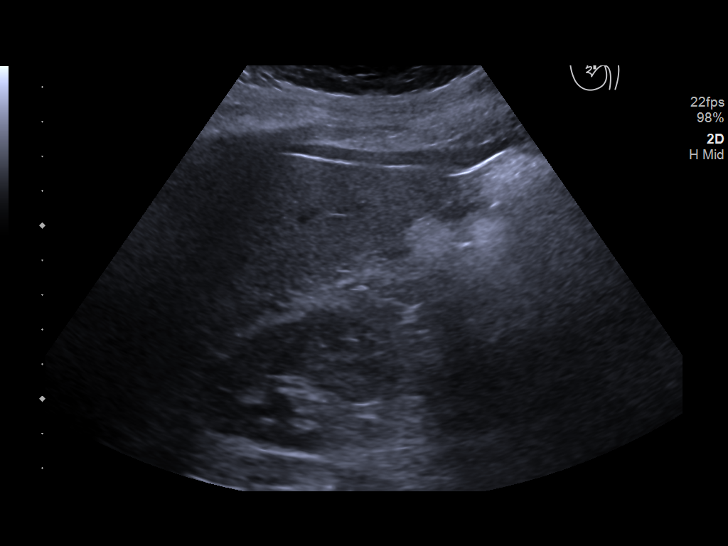
[im 65/81]
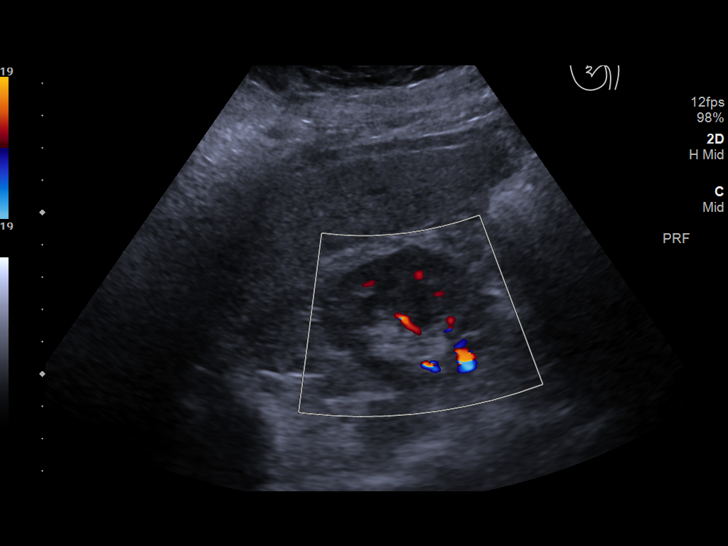
[im 73/81]
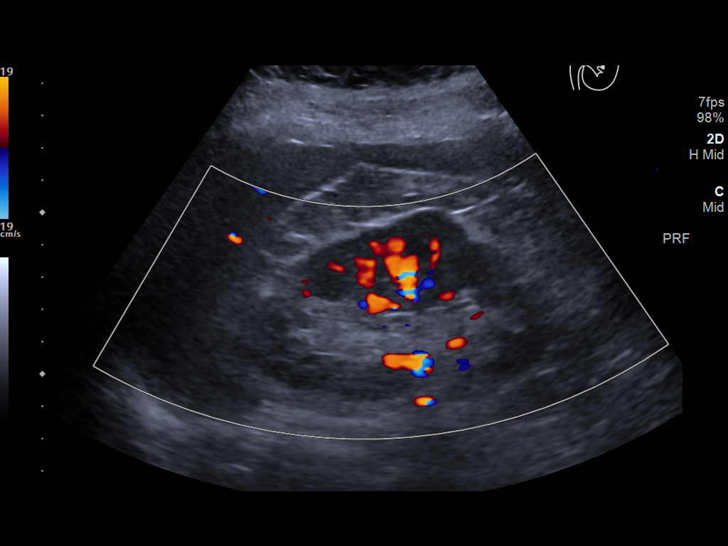
[im 81/81]
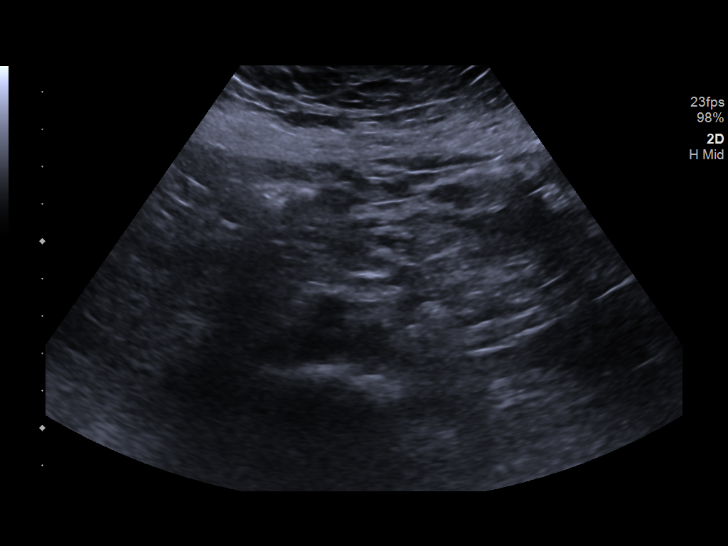

[Series 2: us abdomen complete with elastography · 2 of 14 slices shown (2 of 2)]
[im 5/14]
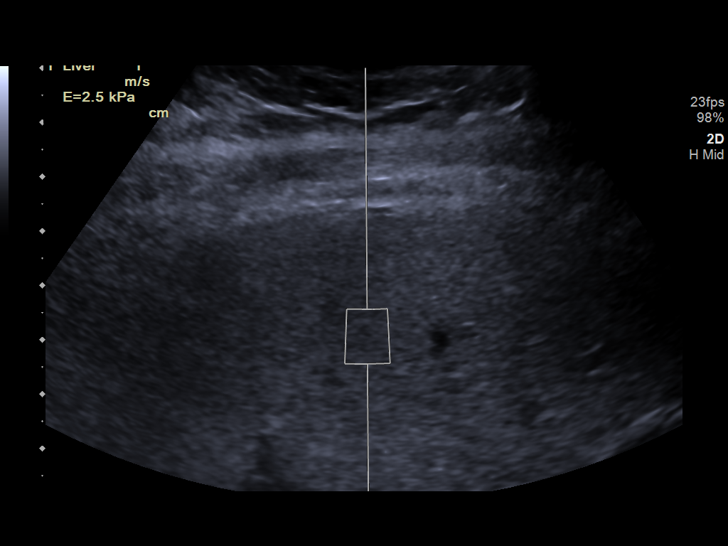
[im 14/14]
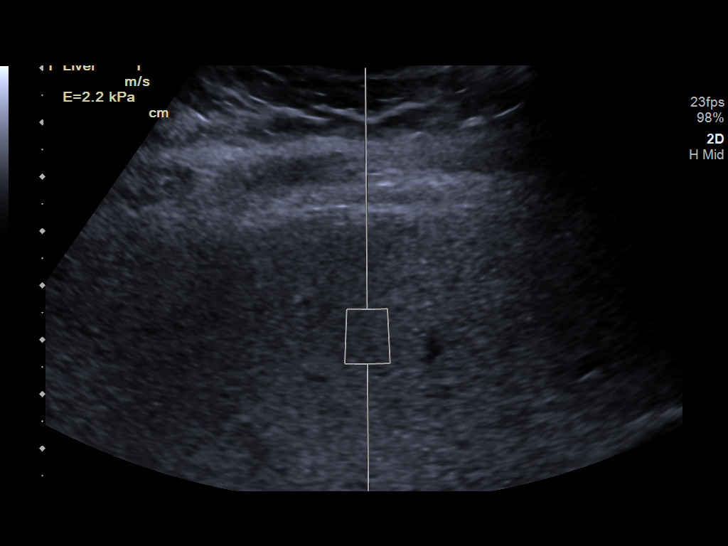

[13 of 25 positions shown; findings below may reference images not displayed]

FINDINGS: ULTRASOUND ABDOMEN

Gallbladder: No gallstones or wall thickening visualized. No
sonographic Murphy sign noted by sonographer.

Common bile duct: Diameter: 4 mm, within normal limits.

Liver: Mildly increased echogenicity of the hepatic parenchyma,
consistent with hepatic steatosis. No hepatic mass identified.
Portal vein is patent on color Doppler imaging with normal direction
of blood flow towards the liver.

IVC: No abnormality visualized.

Pancreas: Visualized portion unremarkable.

Spleen: Size and appearance within normal limits.

Right Kidney: Length: 9.8 cm. Echogenicity within normal limits. No
mass or hydronephrosis visualized.

Left Kidney: Length: 10.8 cm. Echogenicity within normal limits. No
mass or hydronephrosis visualized.

Abdominal aorta: No aneurysm visualized.

Other findings: None.

ULTRASOUND HEPATIC ELASTOGRAPHY

Device: Siemens Helix VTQ

Patient position: Left Lateral Decubitus

Transducer 5C1

Number of measurements: 10

Hepatic segment:  8

Median velocity:   0.98 m/sec

IQR:

IQR/Median velocity ratio:

Corresponding Metavir fibrosis score:  F0/F1

Risk of fibrosis: Minimal

Limitations of exam: None

Please note that abnormal shear wave velocities may also be
identified in clinical settings other than with hepatic fibrosis,
such as: acute hepatitis, elevated right heart and central venous
pressures including use of beta blockers, Yancu disease
(Belkys), infiltrative processes such as
mastocytosis/amyloidosis/infiltrative tumor, extrahepatic
cholestasis, in the post-prandial state, and liver transplantation.
Correlation with patient history, laboratory data, and clinical
condition recommended.
IMPRESSION: ULTRASOUND ABDOMEN:

Mild diffuse hepatic steatosis.  Otherwise negative exam.

ULTRASOUND HEPATIC ELASTOGRAPHY:

Median hepatic shear wave velocity is calculated at 0.98 m/sec.

Corresponding Metavir fibrosis score is F0/F1.

Risk of fibrosis is Minimal.

Follow-up: None required

## 2020-04-25 ENCOUNTER — Ambulatory Visit: Payer: Managed Care, Other (non HMO)

## 2020-12-29 ENCOUNTER — Other Ambulatory Visit: Payer: Self-pay | Admitting: Family Medicine

## 2020-12-29 ENCOUNTER — Other Ambulatory Visit (HOSPITAL_COMMUNITY)
Admission: RE | Admit: 2020-12-29 | Discharge: 2020-12-29 | Disposition: A | Discharge status: Home / Self Care | Payer: Managed Care, Other (non HMO) | Source: Ambulatory Visit | Attending: Family Medicine | Admitting: Family Medicine

## 2020-12-29 DIAGNOSIS — Z01411 Encounter for gynecological examination (general) (routine) with abnormal findings: Secondary | ICD-10-CM | POA: Diagnosis not present

## 2020-12-30 LAB — CYTOLOGY - PAP
Comment: NEGATIVE
Diagnosis: NEGATIVE
High risk HPV: NEGATIVE

## 2021-06-30 ENCOUNTER — Other Ambulatory Visit: Payer: Self-pay | Admitting: Family Medicine

## 2021-06-30 ENCOUNTER — Other Ambulatory Visit (HOSPITAL_COMMUNITY): Payer: Self-pay | Admitting: Family Medicine

## 2021-07-01 ENCOUNTER — Other Ambulatory Visit (HOSPITAL_COMMUNITY): Payer: Self-pay | Admitting: Family Medicine

## 2021-07-01 DIAGNOSIS — E782 Mixed hyperlipidemia: Secondary | ICD-10-CM

## 2021-07-17 ENCOUNTER — Ambulatory Visit (HOSPITAL_COMMUNITY)
Admission: RE | Admit: 2021-07-17 | Discharge: 2021-07-17 | Disposition: A | Discharge status: Home / Self Care | Payer: Self-pay | Source: Ambulatory Visit | Attending: Family Medicine | Admitting: Family Medicine

## 2021-07-17 DIAGNOSIS — E782 Mixed hyperlipidemia: Secondary | ICD-10-CM | POA: Insufficient documentation

## 2021-12-31 ENCOUNTER — Other Ambulatory Visit: Payer: Self-pay | Admitting: Family Medicine

## 2021-12-31 DIAGNOSIS — Z1231 Encounter for screening mammogram for malignant neoplasm of breast: Secondary | ICD-10-CM

## 2022-01-11 ENCOUNTER — Ambulatory Visit
Admission: RE | Admit: 2022-01-11 | Discharge: 2022-01-11 | Disposition: A | Discharge status: Home / Self Care | Payer: 59 | Source: Ambulatory Visit | Attending: Family Medicine | Admitting: Family Medicine

## 2022-01-11 DIAGNOSIS — Z1231 Encounter for screening mammogram for malignant neoplasm of breast: Secondary | ICD-10-CM

## 2023-01-07 ENCOUNTER — Other Ambulatory Visit: Payer: Self-pay | Admitting: Family Medicine

## 2023-01-07 DIAGNOSIS — Z1231 Encounter for screening mammogram for malignant neoplasm of breast: Secondary | ICD-10-CM

## 2023-01-28 ENCOUNTER — Ambulatory Visit: Payer: 59

## 2023-03-03 ENCOUNTER — Ambulatory Visit
Admission: RE | Admit: 2023-03-03 | Discharge: 2023-03-03 | Disposition: A | Discharge status: Home / Self Care | Payer: 59 | Source: Ambulatory Visit | Attending: Family Medicine | Admitting: Family Medicine

## 2023-03-03 DIAGNOSIS — Z1231 Encounter for screening mammogram for malignant neoplasm of breast: Secondary | ICD-10-CM

## 2023-09-23 ENCOUNTER — Encounter: Payer: Self-pay | Admitting: Advanced Practice Midwife

## 2023-12-28 ENCOUNTER — Other Ambulatory Visit: Payer: Self-pay | Admitting: Family Medicine

## 2023-12-28 DIAGNOSIS — Z1231 Encounter for screening mammogram for malignant neoplasm of breast: Secondary | ICD-10-CM

## 2024-03-05 ENCOUNTER — Ambulatory Visit
Admission: RE | Admit: 2024-03-05 | Discharge: 2024-03-05 | Disposition: A | Discharge status: Home / Self Care | Source: Ambulatory Visit | Attending: Family Medicine | Admitting: Family Medicine

## 2024-03-05 DIAGNOSIS — Z1231 Encounter for screening mammogram for malignant neoplasm of breast: Secondary | ICD-10-CM
